# Patient Record
Sex: Male | Born: 1975 | Race: Black or African American | Hispanic: No | Marital: Married | Smoking: Never smoker
Health system: Southern US, Community
[De-identification: ages and names within clinical notes are randomized; demographics above are authoritative.]

## PROBLEM LIST (undated history)

## (undated) DIAGNOSIS — E119 Type 2 diabetes mellitus without complications: Secondary | ICD-10-CM

## (undated) DIAGNOSIS — R079 Chest pain, unspecified: Secondary | ICD-10-CM

## (undated) DIAGNOSIS — E785 Hyperlipidemia, unspecified: Secondary | ICD-10-CM

## (undated) DIAGNOSIS — R519 Headache, unspecified: Secondary | ICD-10-CM

## (undated) DIAGNOSIS — M549 Dorsalgia, unspecified: Secondary | ICD-10-CM

## (undated) HISTORY — DX: Chest pain, unspecified: R07.9

## (undated) HISTORY — DX: Hyperlipidemia, unspecified: E78.5

## (undated) HISTORY — DX: Type 2 diabetes mellitus without complications: E11.9

## (undated) HISTORY — DX: Dorsalgia, unspecified: M54.9

## (undated) HISTORY — DX: Headache, unspecified: R51.9

---

## 2005-08-13 ENCOUNTER — Emergency Department (HOSPITAL_COMMUNITY): Admission: EM | Admit: 2005-08-13 | Discharge: 2005-08-13 | Payer: Self-pay | Admitting: Emergency Medicine

## 2006-04-18 IMAGING — CR DG FINGER THUMB 2+V*R*
3 series · 3 of 3 positions shown · non-contrast
Comparison: none

CLINICAL DATA: Cut by glass, 
 RIGHT THUMB - 3 VIEW: 
 Three views of the right thumb were obtained.  There is soft tissue swelling at the level of the dip joint of the right thumb.  No fracture is seen and no opaque foreign body is noted.

[x finger pa right]
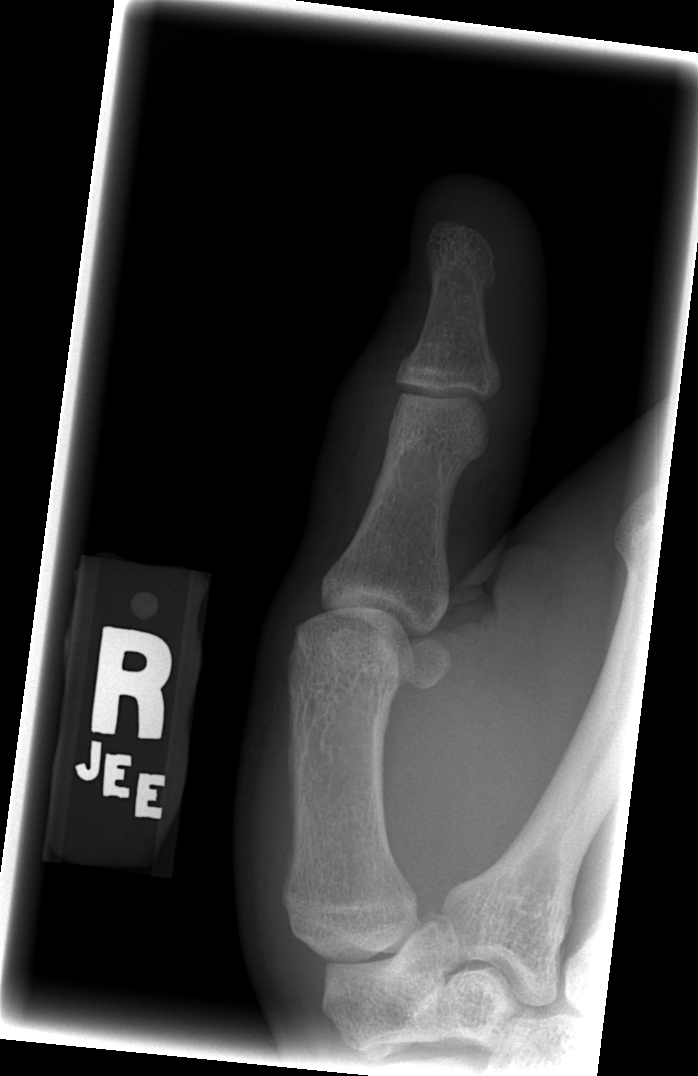

[x finger obl. right]
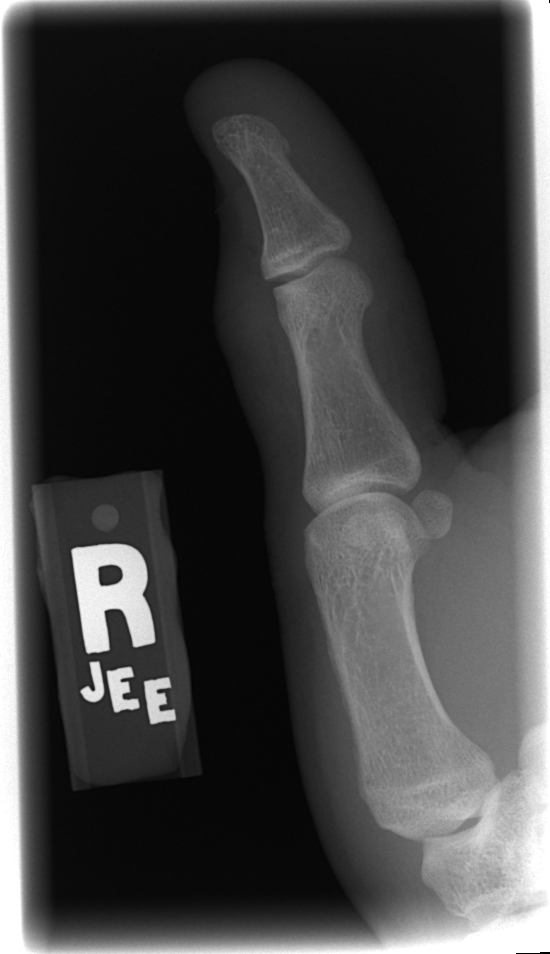

[x finger lateral right]
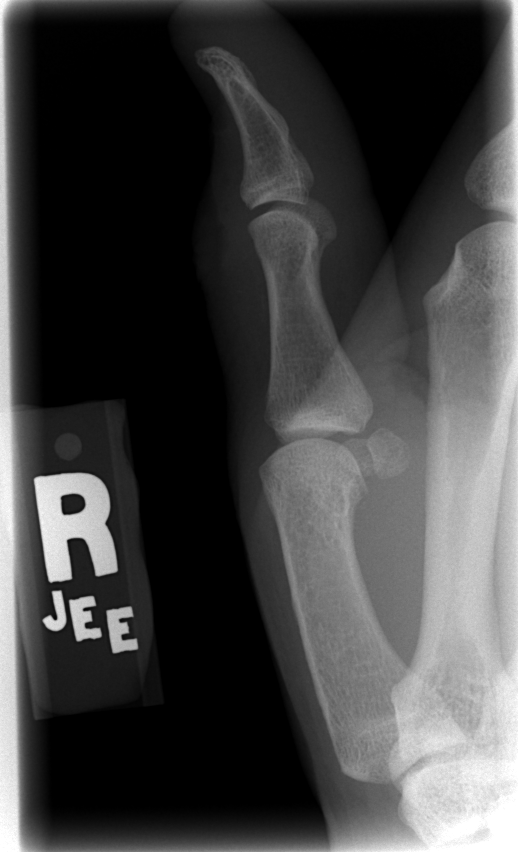

[3 of 3 positions shown; findings below may reference images not displayed]

IMPRESSION: Soft tissue swelling distally. No fracture or opaque foreign body.

## 2014-03-04 ENCOUNTER — Encounter (HOSPITAL_COMMUNITY): Payer: Self-pay | Admitting: Emergency Medicine

## 2014-03-04 ENCOUNTER — Emergency Department (HOSPITAL_COMMUNITY)
Admission: EM | Admit: 2014-03-04 | Discharge: 2014-03-04 | Disposition: A | Discharge status: Home / Self Care | Payer: Self-pay | Attending: Emergency Medicine | Admitting: Emergency Medicine

## 2014-03-04 DIAGNOSIS — K089 Disorder of teeth and supporting structures, unspecified: Secondary | ICD-10-CM | POA: Insufficient documentation

## 2014-03-04 DIAGNOSIS — K0889 Other specified disorders of teeth and supporting structures: Secondary | ICD-10-CM

## 2014-03-04 MED ORDER — PENICILLIN V POTASSIUM 500 MG PO TABS: 500.0000 mg | ORAL_TABLET | Freq: Four times a day (QID) | ORAL | Status: DC

## 2014-03-04 MED ORDER — HYDROCODONE-ACETAMINOPHEN 5-325 MG PO TABS: 1.0000 | ORAL_TABLET | ORAL | Status: DC | PRN

## 2014-03-04 NOTE — ED Provider Notes (Signed)
CSN: [id number]     Arrival date & time 03/04/14  [id number] History   First MD Initiated Contact with Patient 03/04/14 0636     Chief Complaint  Patient presents with  . Dental Pain     (Consider location/radiation/quality/duration/timing/severity/associated sxs/prior Treatment) HPI Comments: The patient is a 38 year old otherwise healthy male who presents with dental pain that started suddenly 3 days ago when he bit into a hard piece of chicken at [employer]. The dental pain is severe, constant and progressively worsening. The pain is aching and located in right upper jaw. The pain does not radiate. Eating makes the pain worse. Nothing makes the pain better. The patient has not tried anything for pain. Patient reports associated right facial swelling. Patient denies headache, neck pain/stiffness, fever, NVD, edema, sore throat, throat swelling, wheezing, SOB, chest pain, abdominal pain.     Patient is a 38 y.o. male presenting with tooth pain.  Dental Pain Associated symptoms: no fever and no neck pain     History reviewed. No pertinent past medical history. History reviewed. No pertinent past surgical history. No family history on file. History  Substance Use Topics  . Smoking status: Never Smoker   . Smokeless tobacco: Not on file  . Alcohol Use: Yes    Review of Systems  Constitutional: Negative for fever, chills and fatigue.  HENT: Positive for dental problem. Negative for trouble swallowing.   Eyes: Negative for visual disturbance.  Respiratory: Negative for shortness of breath.   Cardiovascular: Negative for chest pain and palpitations.  Gastrointestinal: Negative for nausea, vomiting, abdominal pain and diarrhea.  Genitourinary: Negative for dysuria and difficulty urinating.  Musculoskeletal: Negative for arthralgias and neck pain.  Skin: Negative for color change.  Neurological: Negative for dizziness and weakness.  Psychiatric/Behavioral: Negative for dysphoric mood.       Allergies  Review of patient's allergies indicates no known allergies.  Home Medications   Prior to Admission medications   Not on File   BP 145/103  Pulse 89  Temp(Src) 99.3 F (37.4 C) (Oral)  Resp 18  Ht 6\' 1"  (1.854 m)  Wt 253 lb 1 oz (114.788 kg)  BMI 33.39 kg/m2  SpO2 100% Physical Exam  Nursing note and vitals reviewed. Constitutional: He is oriented to person, place, and time. He appears well-developed and well-nourished. No distress.  HENT:  Head: Normocephalic and atraumatic.  Mouth/Throat: Oropharynx is clear and moist. No oropharyngeal exudate.  Fair dentition. Right upper molar with "cap" missing. The tooth is tender to percussion. No abscess noted. Mild right facial swelling.   Eyes: Conjunctivae and EOM are normal.  Neck: Normal range of motion.  Cardiovascular: Normal rate and regular rhythm.  Exam reveals no gallop and no friction rub.   No murmur heard. Pulmonary/Chest: Effort normal and breath sounds normal. He has no wheezes. He has no rales. He exhibits no tenderness.  Musculoskeletal: Normal range of motion.  Neurological: He is alert and oriented to person, place, and time. Coordination normal.  Speech is goal-oriented. Moves limbs without ataxia.   Skin: Skin is warm and dry.  Psychiatric: He has a normal mood and affect. His behavior is normal.    ED Course  Procedures (including critical care time) Labs Review Labs Reviewed - No data to display  Imaging Review No results found.   EKG Interpretation None      MDM   Final diagnoses:  Pain, dental    6:46 AM Patient will have a prescription for vicodin  and Veetid. Patient will be referred to a dentist for follow up. No abscess noted. Vitals stable and patient afebrile. Patient instructed to return with worsening or concerning symptoms.     Emilia BeckKaitlyn Asahd Can, [place] JerseyPA-C 03/04/14 [phone]

## 2014-03-04 NOTE — ED Notes (Signed)
Toothache since Friday when he ate a hard piece of chicken.  His rt face swelled up after that

## 2014-03-04 NOTE — Discharge Instructions (Signed)
Take Veetid as directed until gone. Take vicodin as needed for pain. Follow up with the recommended dentist for further evaluation and management of your symptoms.

## 2014-03-05 NOTE — ED Provider Notes (Signed)
Medical screening examination/treatment/procedure(s) were performed by non-physician practitioner and as supervising physician I was immediately available for consultation/collaboration.   EKG Interpretation None        Kathleen M McManus, DO 03/05/14 1810 

## 2020-06-03 ENCOUNTER — Other Ambulatory Visit: Payer: Self-pay

## 2020-06-03 DIAGNOSIS — Z20822 Contact with and (suspected) exposure to covid-19: Secondary | ICD-10-CM

## 2020-06-05 LAB — SARS-COV-2, NAA 2 DAY TAT

## 2020-06-05 LAB — NOVEL CORONAVIRUS, NAA: SARS-CoV-2, NAA: NOT DETECTED

## 2020-11-04 ENCOUNTER — Ambulatory Visit: Payer: Self-pay | Attending: Internal Medicine

## 2020-11-04 ENCOUNTER — Other Ambulatory Visit (HOSPITAL_COMMUNITY): Payer: Self-pay | Admitting: Internal Medicine

## 2020-11-04 DIAGNOSIS — Z23 Encounter for immunization: Secondary | ICD-10-CM

## 2020-11-04 NOTE — Progress Notes (Signed)
   Covid-19 Vaccination Clinic  Name:  GAMBLE ENDERLE    MRN: [id number] DOB: 1976/08/20  11/04/2020  Mr. Strycharz was observed post Covid-19 immunization for 15 minutes without incident. He was provided with Vaccine Information Sheet and instruction to access the V-Safe system.   Mr. Mestre was instructed to call 911 with any severe reactions post vaccine: Marland Kitchen Difficulty breathing  . Swelling of face and throat  . A fast heartbeat  . A bad rash all over body  . Dizziness and weakness   Immunizations Administered    Name Date Dose VIS Date Route   PFIZER Comrnaty(Gray TOP) Covid-19 Vaccine 11/04/2020  9:49 AM 0.3 mL 08/21/2020 Intramuscular   Manufacturer: [employer], [employer]   Lot: [id number]   NDC: [phone]

## 2022-10-15 ENCOUNTER — Ambulatory Visit: Payer: Self-pay | Admitting: Interventional Cardiology

## 2022-11-03 ENCOUNTER — Ambulatory Visit: Payer: BC Managed Care – PPO | Attending: Interventional Cardiology | Admitting: Interventional Cardiology

## 2022-11-03 VITALS — BP 142/90 | HR 74 | Ht 73.0 in | Wt 282.0 lb

## 2022-11-03 DIAGNOSIS — E119 Type 2 diabetes mellitus without complications: Secondary | ICD-10-CM

## 2022-11-03 DIAGNOSIS — R072 Precordial pain: Secondary | ICD-10-CM

## 2022-11-03 DIAGNOSIS — E782 Mixed hyperlipidemia: Secondary | ICD-10-CM | POA: Diagnosis not present

## 2022-11-03 MED ORDER — METOPROLOL TARTRATE 100 MG PO TABS: ORAL_TABLET | ORAL | 0 refills | Status: AC

## 2022-11-03 NOTE — Patient Instructions (Addendum)
Medication Instructions:  Your physician recommends that you continue on your current medications as directed. Please refer to the Current Medication list given to you today.  *If you need a refill on your cardiac medications before your next appointment, please call your pharmacy*  Lab Work: Your physician recommends that you have lab work today- BMET  If you have labs (blood work) drawn today and your tests are completely normal, you will receive your results only by: MyChart Message (if you have MyChart) OR A paper copy in the mail If you have any lab test that is abnormal or we need to change your treatment, we will call you to review the results.  Testing/Procedures: Your physician has requested that you have cardiac CT. Cardiac computed tomography (CT) is a painless test that uses an x-ray machine to take clear, detailed pictures of your heart. For further information please visit [email or web address]. Please follow instruction sheet as given.  Follow-Up: At [facility], you and your health needs are our priority.  As part of our continuing mission to provide you with exceptional heart care, we have created designated Provider Care Teams.  These Care Teams include your primary Cardiologist (physician) and Advanced Practice Providers (APPs -  Physician Assistants and Nurse Practitioners) who all work together to provide you with the care you need, when you need it.  We recommend signing up for the patient portal called "MyChart".  Sign up information is provided on this After Visit Summary.  MyChart is used to connect with patients for Virtual Visits (Telemedicine).  Patients are able to view lab/test results, encounter notes, upcoming appointments, etc.  Non-urgent messages can be sent to your provider as well.   To learn more about what you can do with MyChart, go to [email or web address].    Your next appointment:   12 month(s)  Provider:   Larae Grooms, MD      Other Instructions   Your cardiac CT will be scheduled at one of the below locations:   [facility] [address] [place], [place] [address] [phone]   If scheduled at [facility], please arrive at the [facility] and Children's Entrance (Entrance C2) of [facility] 30 minutes prior to test start time. You can use the FREE valet parking offered at entrance C (encouraged to control the heart rate for the test)  Proceed to the [facility] Department (first floor) to check-in and test prep.  All radiology patients and guests should use entrance C2 at [facility], accessed from [facility], even though the hospital's physical address listed is [address]     Please follow these instructions carefully (unless otherwise directed):  Hold all erectile dysfunction medications at least 3 days (72 hrs) prior to test. (Ie viagra, cialis, sildenafil, tadalafil, etc) We will administer nitroglycerin during this exam.   On the Night Before the Test: Be sure to Drink plenty of water. Do not consume any caffeinated/decaffeinated beverages or chocolate 12 hours prior to your test. Do not take any antihistamines 12 hours prior to your test.  On the Day of the Test: Drink plenty of water until 1 hour prior to the test. Do not eat any food 1 hour prior to test. You may take your regular medications prior to the test.  Take metoprolol (Lopressor) 100 mg two hours prior to test.  After the Test: Drink plenty of water. After receiving IV contrast, you may experience a mild flushed feeling.  This is normal. On occasion, you may experience a mild rash up to 24 hours after the test. This is not dangerous. If this occurs, you can take Benadryl 25 mg and increase your fluid intake. If you experience trouble breathing, this can be serious. If it is severe call 911 IMMEDIATELY. If it is mild, please call our office. If you take any  of these medications: Glipizide please do not take 48 hours after completing test unless otherwise instructed.  We will call to schedule your test 2-4 weeks out understanding that some insurance companies will need an authorization prior to the service being performed.   For non-scheduling related questions, please contact the cardiac imaging nurse navigator should you have any questions/concerns: Marchia Bond, Cardiac Imaging Nurse Navigator Gordy Clement, Cardiac Imaging Nurse Navigator [facility] Direct Office Dial: [phone]   For scheduling needs, including cancellations and rescheduling, please call [place], [phone].

## 2022-11-03 NOTE — Progress Notes (Signed)
Cardiology Office Note   Date:  11/03/2022   ID:  Terry Burgess, DOB [birth year], MRN [id number]  PCP:  Patient, No Pcp Per    No chief complaint on file.  Chest pain  Wt Readings from Last 3 Encounters:  11/03/22 282 lb (127.9 kg)  03/04/14 253 lb 1 oz (114.8 kg)       History of Present Illness: Terry Burgess is a 47 y.o. male who is being seen today for the evaluation of chest pain at the request of Jettie Booze, MD.   He reported headache, chest pain and back pain to his primary care doctor in January 2024.  He has risk factors including diabetes and hyperlipidemia.  Last A1c was 7.2.  Records from primary care doctor show: "Pt is having recurrent chest pain. He says this is the real reason he scheduled the appt, he wants to start exercise program and wants to make sure his heart is ok. Tires easily Knows a lot of family, friends passing in late 65's from heart diseases/MI Maternal uncle (passed MI right before his 62th birthday), maternal cousin (male, passed a couple of months after her 18th birthday) & one paternal cousin passed 68 MI. " "he feels a deep ache in the left side of chest. Feels like he's being hit with a hammer. This only happens when he is on the couch. Walking or laying down makes it go away. "  Feels like a "bone pain" and occurs several times a day.  Not related to exertion.  Several family members have had heart problems in the their late 42s.    Denies : Chest pain. Dizziness. Leg edema. Nitroglycerin use. Orthopnea. Palpitations. Paroxysmal nocturnal dyspnea. Shortness of breath. Syncope.    Does not eat healthy.  No regular exercise.  Starting stretching.  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   Past Medical History:  Diagnosis Date   Back pain    Chest pain    Diabetes ([place])    Headache    Hyperlipidemia     No past surgical history on file.   Current Outpatient Medications  Medication Sig Dispense Refill    cyclobenzaprine (FLEXERIL) 10 MG tablet Take 10 mg by mouth 3 (three) times daily as needed for muscle spasms.     metFORMIN (GLUCOPHAGE) 1000 MG tablet Take 1,000 mg by mouth 2 (two) times daily with a meal.     rosuvastatin (CRESTOR) 10 MG tablet Take 10 mg by mouth daily.     No current facility-administered medications for this visit.    Allergies:   Patient has no known allergies.    Social History:  The patient  reports that he has never smoked. He does not have any smokeless tobacco history on file. He reports current alcohol use.   Family History:  The patient's family history includes Heart attack (age of onset: 49) in his cousin; Heart attack (age of onset: 54) in his cousin and maternal uncle.    ROS:  Please see the history of present illness.   Otherwise, review of systems are positive for chest pain.   All other systems are reviewed and negative.    PHYSICAL EXAM: VS:  BP (!) 142/90 (BP Location: Left Arm, Patient Position: Sitting, Cuff Size: Large)   Pulse 74   Ht 6' 1"$  (1.854 m)   Wt 282 lb (127.9 kg)   SpO2 98%   BMI 37.21 kg/m  , BMI Body mass  index is 37.21 kg/m. GEN: Well nourished, well developed, in no acute distress HEENT: normal Neck: no JVD, carotid bruits, or masses Cardiac: RRR; no murmurs, rubs, or gallops,no edema  Respiratory:  clear to auscultation bilaterally, normal work of breathing GI: soft, nontender, nondistended, + BS MS: no deformity or atrophy Skin: warm and dry, no rash Neuro:  Strength and sensation are intact Psych: euthymic mood, full affect   EKG:   The ekg ordered today demonstrates NSR, nonspecific ST changes   Recent Labs: No results found for requested labs within last 365 days.   Lipid Panel No results found for: "CHOL", "TRIG", "HDL", "CHOLHDL", "VLDL", "LDLCALC", "LDLDIRECT"   Other studies Reviewed: Additional studies/ records that were reviewed today with results demonstrating: Labs reviewed.  June 2023  triglycerides 231 HDL 24 LDL 85 total cholesterol 155.  Creatinine 0.93.   ASSESSMENT AND PLAN:  Chest pain: Some typical and some atypical features.  Multiple risk factors for heart disease.  He does want to start an exercise program.  Plan for coronary CTA to evaluate for obstructive CAD.  This will also give [place] a calcium score which would help tailor his preventive therapy.  Metoprolol 100 mg prior to the scan.  Diabetes: A1c 7.2.  I stressed the importance of diabetes control to help reduce his risk of heart disease.  Exercise to 150 minutes/week will help lower blood sugar. Hyperlipidemia: Whole food, plant-based diet recommended.  High-fiber diet.  Avoid processed foods.  Was vegan in the past.   Morbid obesity: High protein.   We talked about resistance training.     Current medicines are reviewed at length with the patient today.  The patient concerns regarding his medicines were addressed.  The following changes have been made:  No change  Labs/ tests ordered today include:  No orders of the defined types were placed in this encounter.   Recommend 150 minutes/week of aerobic exercise Low fat, low carb, high fiber diet recommended  Disposition:   FU in 1 year or sooner if CTA is abnormal   Signed, Larae Grooms, MD  11/03/2022 2:14 PM    [facility] [place], [place], [place]  [address] Phone: [phone]; Fax: [phone]

## 2022-11-04 LAB — BASIC METABOLIC PANEL
BUN/Creatinine Ratio: 8 — ABNORMAL LOW (ref 9–20)
BUN: 7 mg/dL (ref 6–24)
CO2: 24 mmol/L (ref 20–29)
Calcium: 9.2 mg/dL (ref 8.7–10.2)
Chloride: 102 mmol/L (ref 96–106)
Creatinine, Ser: 0.93 mg/dL (ref 0.76–1.27)
Glucose: 190 mg/dL — ABNORMAL HIGH (ref 70–99)
Potassium: 4.2 mmol/L (ref 3.5–5.2)
Sodium: 138 mmol/L (ref 134–144)
eGFR: 101 mL/min/{1.73_m2} (ref >59)

## 2022-11-05 NOTE — Addendum Note (Signed)
Addended by: Maryan Rued on: 11/05/2022 09:46 AM   Modules accepted: Orders

## 2022-12-02 ENCOUNTER — Encounter (HOSPITAL_COMMUNITY): Payer: Self-pay

## 2022-12-10 ENCOUNTER — Telehealth (HOSPITAL_COMMUNITY): Payer: Self-pay | Admitting: *Deleted

## 2022-12-10 NOTE — Telephone Encounter (Signed)
Attempted to call patient regarding upcoming cardiac CT appointment. °Left message on voicemail with name and callback number ° °Ezel Vallone RN Navigator Cardiac Imaging °[facility] °[phone] Office °[phone] Cell ° °

## 2022-12-13 ENCOUNTER — Ambulatory Visit (HOSPITAL_COMMUNITY): Admission: RE | Admit: 2022-12-13 | Payer: BC Managed Care – PPO | Source: Ambulatory Visit

## 2022-12-13 ENCOUNTER — Telehealth: Payer: Self-pay | Admitting: *Deleted

## 2022-12-13 NOTE — Telephone Encounter (Signed)
Received message that CT Scan was authorized by patient's insurance but patient canceled test due to cost.  I placed call to patient to follow up.  Left message to call office

## 2022-12-21 NOTE — Telephone Encounter (Signed)
I spoke with patient.  He received message from insurance that his out of pocket cost would be $1200.  He is unable to afford this. Patient not sure if this amount was after insurance paid.  Will check with pre cert regarding out of pocket cost for patient

## 2022-12-21 NOTE — Telephone Encounter (Signed)
Pt returning call

## 2023-01-20 NOTE — Telephone Encounter (Signed)
Call placed to patient to follow up.  Left message to call office

## 2023-01-31 NOTE — Telephone Encounter (Signed)
Left message to call office

## 2023-02-09 NOTE — Telephone Encounter (Signed)
Left message to call office.  Message sent to patient through my chart
